# Patient Record
Sex: Female | Born: 2000 | Race: Black or African American | Hispanic: No | Marital: Single | Smoking: Never smoker
Health system: Southern US, Community
[De-identification: ages and names within clinical notes are randomized; demographics above are authoritative.]

---
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
## 2020-02-03 IMAGING — DX DG LUMBAR SPINE COMPLETE 4+V
5 series · 5 of 5 positions shown · non-contrast
Comparison: None.

CLINICAL DATA: Back contusion

EXAM:
LUMBAR SPINE - COMPLETE 4+ VIEW

[l-spine ap]
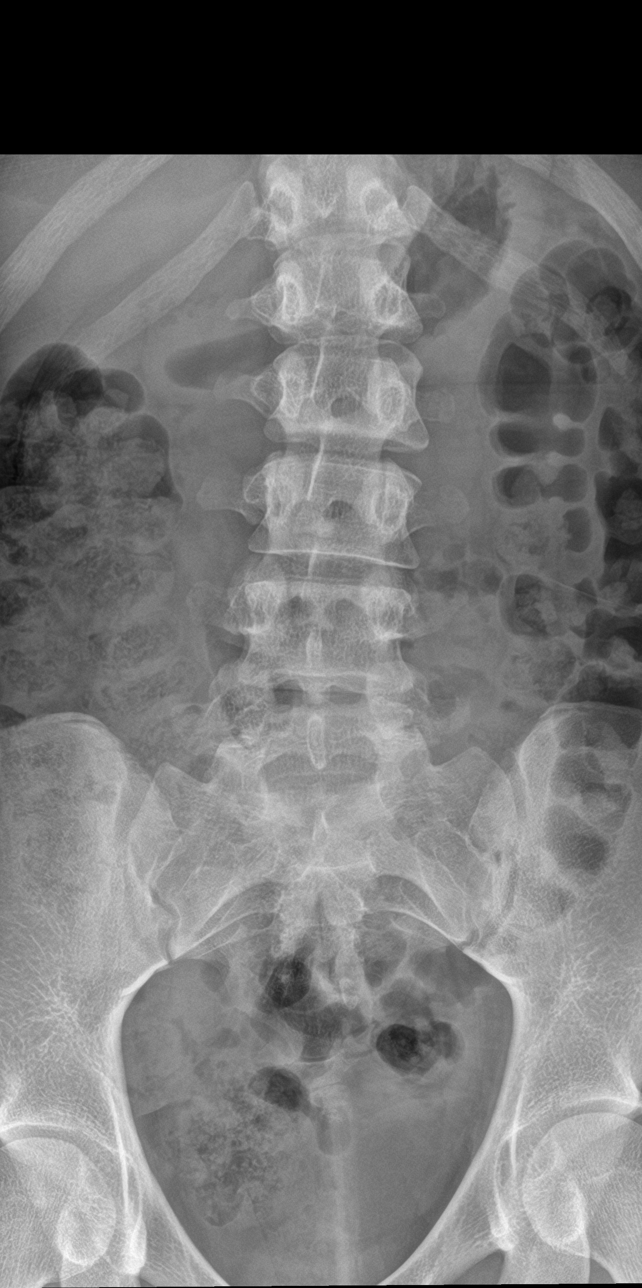

[l-spine obl (1 of 2)]
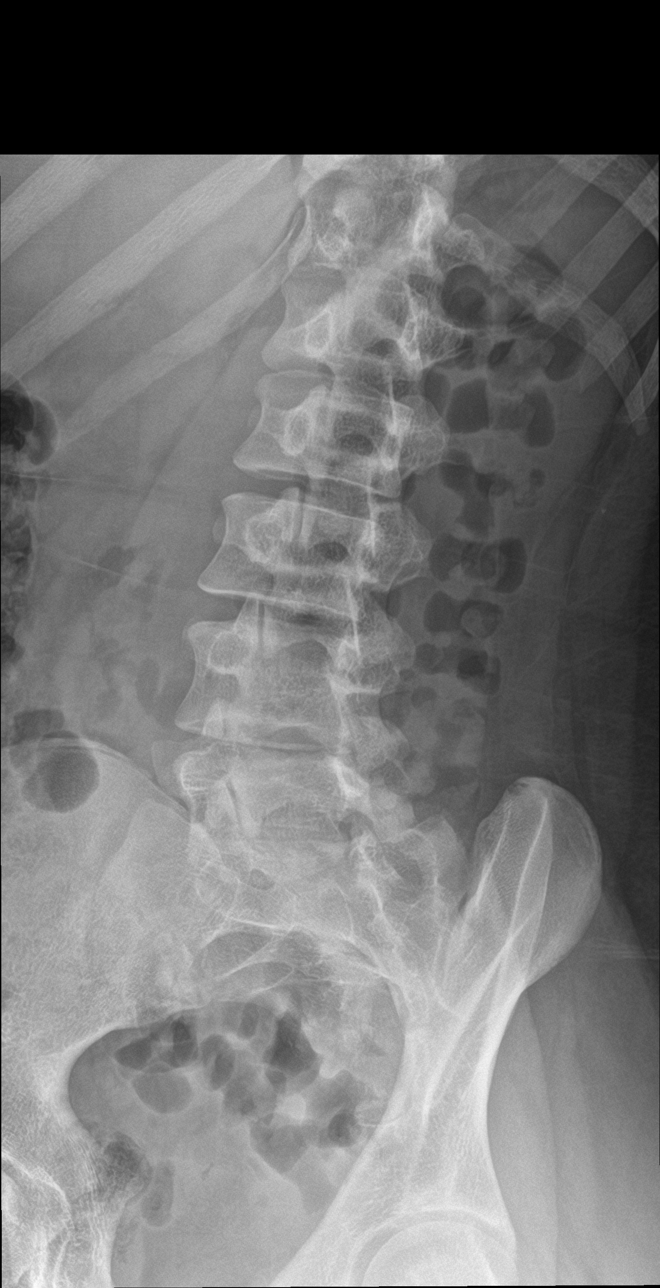

[l-spine obl (2 of 2)]
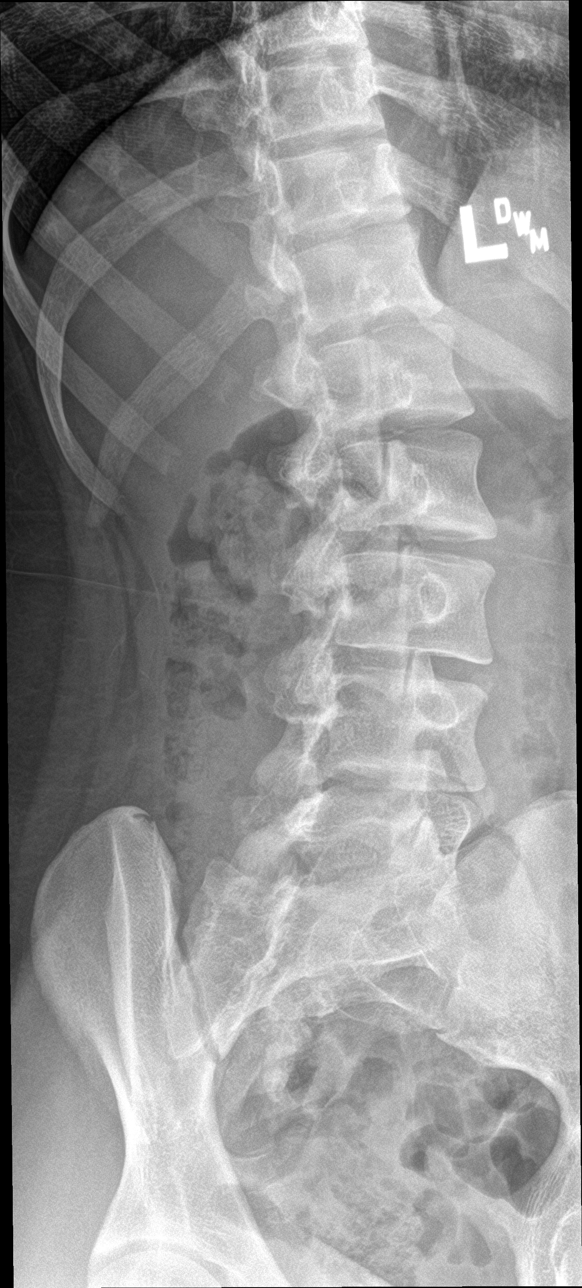

[l-spine lat]
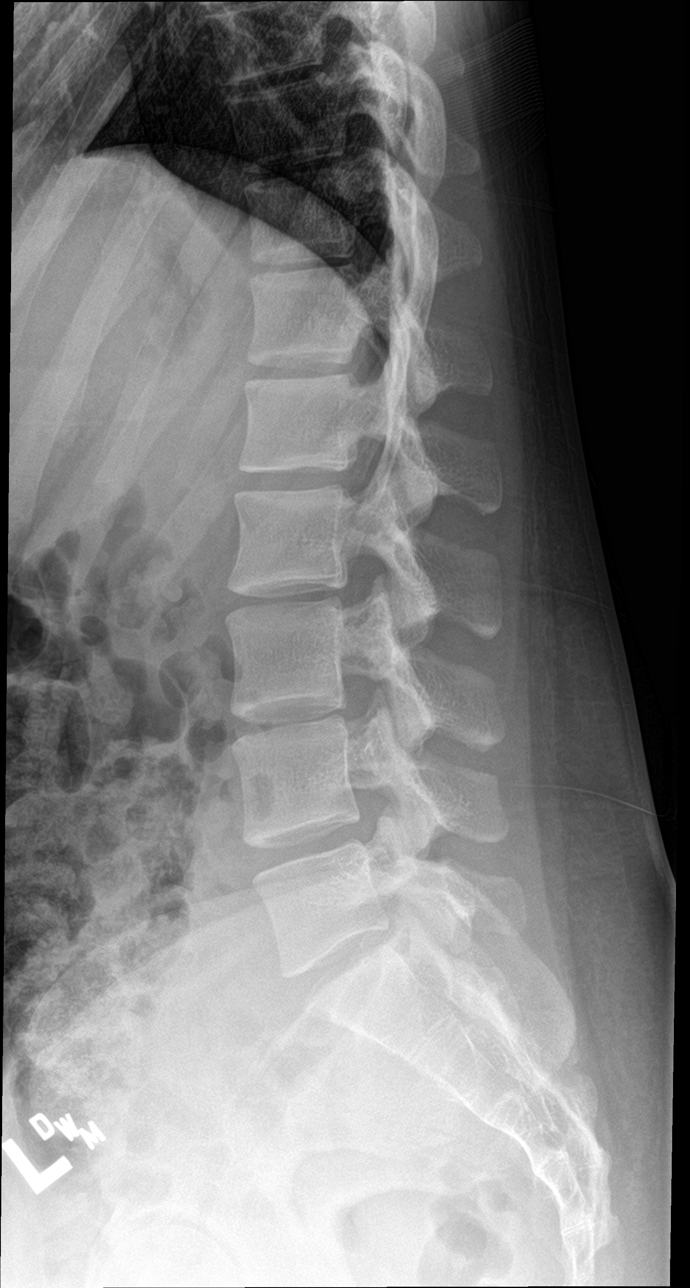

[l-spine spot]
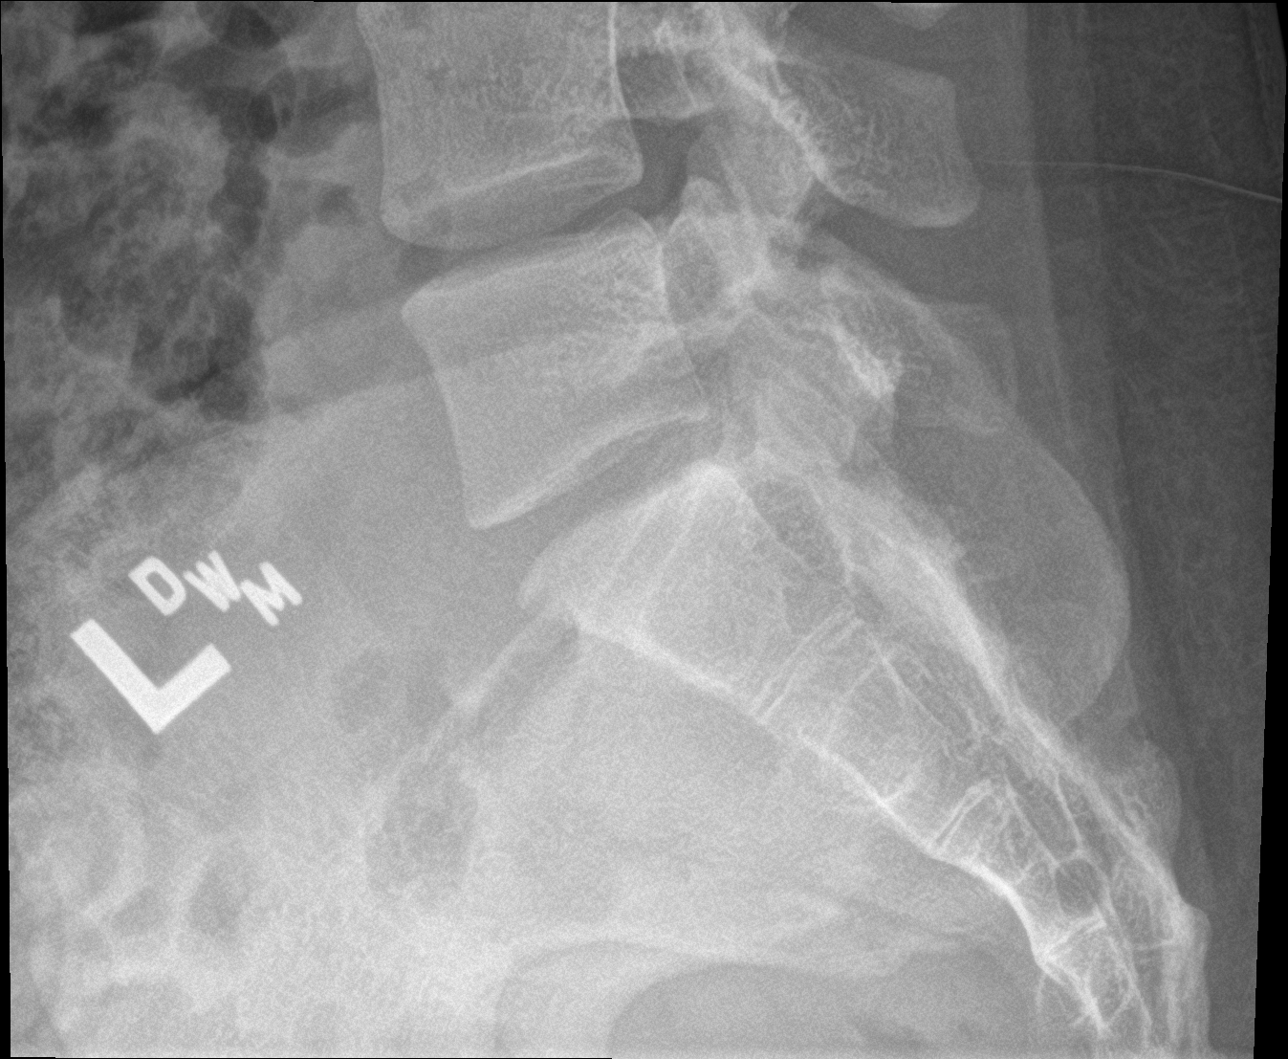

[5 of 5 positions shown; findings below may reference images not displayed]

FINDINGS: Mild levoscoliosis. Negative for fracture. Normal alignment without
focal bony abnormality.
IMPRESSION: Negative

## 2021-05-05 ENCOUNTER — Encounter (HOSPITAL_COMMUNITY): Payer: Self-pay | Admitting: Obstetrics and Gynecology

## 2021-05-05 ENCOUNTER — Inpatient Hospital Stay (HOSPITAL_COMMUNITY)
Admission: AD | Admit: 2021-05-05 | Discharge: 2021-05-05 | Disposition: A | Discharge status: Home / Self Care | Payer: 59 | Attending: Obstetrics and Gynecology | Admitting: Obstetrics and Gynecology

## 2021-05-05 ENCOUNTER — Other Ambulatory Visit: Payer: Self-pay

## 2021-05-05 DIAGNOSIS — O219 Vomiting of pregnancy, unspecified: Secondary | ICD-10-CM | POA: Diagnosis not present

## 2021-05-05 DIAGNOSIS — Z3A08 8 weeks gestation of pregnancy: Secondary | ICD-10-CM | POA: Insufficient documentation

## 2021-05-05 DIAGNOSIS — R0981 Nasal congestion: Secondary | ICD-10-CM | POA: Diagnosis not present

## 2021-05-05 DIAGNOSIS — R109 Unspecified abdominal pain: Secondary | ICD-10-CM | POA: Diagnosis not present

## 2021-05-05 DIAGNOSIS — O26891 Other specified pregnancy related conditions, first trimester: Secondary | ICD-10-CM | POA: Diagnosis present

## 2021-05-05 LAB — URINALYSIS, ROUTINE W REFLEX MICROSCOPIC
Bilirubin Urine: NEGATIVE
Glucose, UA: NEGATIVE mg/dL
Hgb urine dipstick: NEGATIVE
Ketones, ur: 5 mg/dL — AB
Nitrite: NEGATIVE
Protein, ur: NEGATIVE mg/dL
Specific Gravity, Urine: 1.02 (ref 1.005–1.030)
pH: 6 (ref 5.0–8.0)

## 2021-05-05 LAB — POCT PREGNANCY, URINE: Preg Test, Ur: POSITIVE — AB

## 2021-05-05 MED ORDER — GLYCOPYRROLATE 1 MG PO TABS
1.0000 mg | ORAL_TABLET | Freq: Once | ORAL | Status: DC
  Filled 2021-05-05: qty 1

## 2021-05-05 MED ORDER — ONDANSETRON 4 MG PO TBDP
4.0000 mg | ORAL_TABLET | Freq: Once | ORAL | Status: AC
  Administered 2021-05-05: 4 mg via ORAL
  Filled 2021-05-05: qty 1

## 2021-05-05 MED ORDER — ONDANSETRON 4 MG PO TBDP: 4.0000 mg | ORAL_TABLET | Freq: Three times a day (TID) | ORAL | 2 refills | Status: AC | PRN

## 2021-05-05 MED ORDER — GLYCOPYRROLATE 1 MG PO TABS: 1.0000 mg | ORAL_TABLET | Freq: Three times a day (TID) | ORAL | 1 refills | Status: DC | PRN

## 2021-05-05 NOTE — MAU Provider Note (Signed)
History     [id number]  Arrival date and time: 05/05/21 1608    Chief Complaint  Patient presents with   Nausea   Emesis   Nasal Congestion     HPI Morgan Curtis is a 21 y.o. at [redacted]w[redacted]d by LMP, who presents for nausea and vomiting.   Patient reports she has had some mild n/v since becoming pregnant but today became much more significant Has not been able to keep much down Also having intermittent cramping No vaginal bleeding or discharge No burning or pain with urination She has been taking some reglan but it just makes her sleepy and doesn't help much with her nausea     OB History     Gravida  1   Para      Term      Preterm      AB      Living         SAB      IAB      Ectopic      Multiple      Live Births              No past medical history on file.  No past surgical history on file.  No family history on file.  Social History   Socioeconomic History   Marital status: Single    Spouse name: Not on file   Number of children: Not on file   Years of education: Not on file   Highest education level: Not on file  Occupational History   Not on file  Tobacco Use   Smoking status: Never   Smokeless tobacco: Never  Vaping Use   Vaping Use: Never used  Substance and Sexual Activity   Alcohol use: No   Drug use: No   Sexual activity: Never    Birth control/protection: Abstinence  Other Topics Concern   Not on file  Social History Narrative   Not on file   Social Determinants of Health   Financial Resource Strain: Not on file  Food Insecurity: Not on file  Transportation Needs: Not on file  Physical Activity: Not on file  Stress: Not on file  Social Connections: Not on file  Intimate Partner Violence: Not on file    No Known Allergies  No current facility-administered medications on file prior to encounter.   Current Outpatient Medications on File Prior to Encounter  Medication Sig Dispense Refill   metoCLOPramide  (REGLAN) 10 MG tablet Take 10 mg by mouth 4 (four) times daily.     HYDROcodone-acetaminophen (NORCO/VICODIN) 5-325 MG tablet Take 1 tablet by mouth every 6 (six) hours as needed for moderate pain or severe pain. 10 tablet 0     ROS Pertinent positives and negative per HPI, all others reviewed and negative  Physical Exam   BP 118/72 (BP Location: Right Arm)    Pulse 93    Temp 99 F (37.2 C) (Oral)    Resp 17    Ht 5\' 6"  (1.676 m)    Wt 66.7 kg    SpO2 100%    BMI 23.73 kg/m   Patient Vitals for the past 24 hrs:  BP Temp Temp src Pulse Resp SpO2 Height Weight  05/05/21 1750 118/72 99 F (37.2 C) Oral 93 17 100 % 5\' 6"  (1.676 m) 66.7 kg    Physical Exam Vitals reviewed.  Constitutional:      General: She is not in acute distress.    Appearance: She  is well-developed. She is not diaphoretic.  Eyes:     General: No scleral icterus. Pulmonary:     Effort: Pulmonary effort is normal. No respiratory distress.  Abdominal:     General: There is no distension.     Palpations: Abdomen is soft.     Tenderness: There is no abdominal tenderness. There is no guarding or rebound.  Skin:    General: Skin is warm and dry.  Neurological:     Mental Status: She is alert.     Coordination: Coordination normal.     Cervical Exam    Bedside Ultrasound Pt informed that the ultrasound is considered a limited OB ultrasound and is not intended to be a complete ultrasound exam.  Patient also informed that the ultrasound is not being completed with the intent of assessing for fetal or placental anomalies or any pelvic abnormalities.  Explained that the purpose of todays ultrasound is to assess for  viability.  Patient acknowledges the purpose of the exam and the limitations of the study.    My interpretation: viable single IUP with normal fetal cardiac activity seen, yolk sac seen.    Labs Results for orders placed or performed during the hospital encounter of 05/05/21 (from the past 24  hour(s))  Pregnancy, urine POC     Status: Abnormal   Collection Time: 05/05/21  4:30 PM  Result Value Ref Range   Preg Test, Ur POSITIVE (A) NEGATIVE  Urinalysis, Routine w reflex microscopic Urine, Clean Catch     Status: Abnormal   Collection Time: 05/05/21  5:10 PM  Result Value Ref Range   Color, Urine YELLOW YELLOW   APPearance HAZY (A) CLEAR   Specific Gravity, Urine 1.020 1.005 - 1.030   pH 6.0 5.0 - 8.0   Glucose, UA NEGATIVE NEGATIVE mg/dL   Hgb urine dipstick NEGATIVE NEGATIVE   Bilirubin Urine NEGATIVE NEGATIVE   Ketones, ur 5 (A) NEGATIVE mg/dL   Protein, ur NEGATIVE NEGATIVE mg/dL   Nitrite NEGATIVE NEGATIVE   Leukocytes,Ua TRACE (A) NEGATIVE   RBC / HPF 0-5 0 - 5 RBC/hpf   WBC, UA 6-10 0 - 5 WBC/hpf   Bacteria, UA FEW (A) NONE SEEN   Squamous Epithelial / LPF 6-10 0 - 5   Mucus PRESENT     Imaging No results found.  MAU Course  Procedures Lab Orders         Urinalysis, Routine w reflex microscopic Urine, Clean Catch         Pregnancy, urine POC    Meds ordered this encounter  Medications   ondansetron (ZOFRAN-ODT) disintegrating tablet 4 mg   glycopyrrolate (ROBINUL) tablet 1 mg   ondansetron (ZOFRAN-ODT) 4 MG disintegrating tablet    Sig: Take 1 tablet (4 mg total) by mouth every 8 (eight) hours as needed for nausea or vomiting.    Dispense:  30 tablet    Refill:  2   glycopyrrolate (ROBINUL) 1 MG tablet    Sig: Take 1 tablet (1 mg total) by mouth 3 (three) times daily as needed (excessive saliva).    Dispense:  30 tablet    Refill:  1   Imaging Orders  No imaging studies ordered today    MDM moderate  Assessment and Plan  #Nausea and vomiting of pregnancy #[redacted] weeks gestation of pregnancy Given dose of zofran after counseling on possible association with cardiac/craniofacial defects as well as the still overall low absolute risk of these. Significant improvement in symptoms and able to  pass PO challenge. Also reported significant saliva, will  send home with rx for zofran and robinul.   #Abdominal pain in pregnancy, first trimester Viable IUP seen on bedside US, ruled out for ectopic.  Dispo: discharged to home in stable condition.   Venora Maples, MD/MPH 05/05/21 7:45 PM  Allergies as of 05/05/2021   No Known Allergies      Medication List     STOP taking these medications    HYDROcodone-acetaminophen 5-325 MG tablet Commonly known as: NORCO/VICODIN   metoCLOPramide 10 MG tablet Commonly known as: REGLAN       TAKE these medications    glycopyrrolate 1 MG tablet Commonly known as: Robinul Take 1 tablet (1 mg total) by mouth 3 (three) times daily as needed (excessive saliva).   ondansetron 4 MG disintegrating tablet Commonly known as: ZOFRAN-ODT Take 1 tablet (4 mg total) by mouth every 8 (eight) hours as needed for nausea or vomiting.

## 2021-05-05 NOTE — MAU Note (Signed)
...  Morgan Curtis is a 21 y.o. at  here in MAU reporting: Occasional vomiting since 0600 this morning. Last vomiting episode was around 1500 today. She is also endorsing a runny nose since last night. She states she has been having intermittent lower abdominal cramping since the beginning of her pregnancy and currently rates the pain a 5/10.  Has been taking Reglan. Last dose was two days ago. She states she has not been taking it because it tends to make her drowsy.  Pain score:  5/10 lower abdomen  Lab orders placed from triage: UA

## 2021-05-18 HISTORY — PX: DILATION AND CURETTAGE OF UTERUS: SHX78

## 2021-09-17 ENCOUNTER — Encounter: Payer: Self-pay | Admitting: Student

## 2021-09-17 ENCOUNTER — Inpatient Hospital Stay (HOSPITAL_COMMUNITY)
Admission: AD | Admit: 2021-09-17 | Discharge: 2021-09-17 | Disposition: A | Discharge status: Home / Self Care | Payer: 59 | Attending: Obstetrics | Admitting: Obstetrics

## 2021-09-17 DIAGNOSIS — Z3A11 11 weeks gestation of pregnancy: Secondary | ICD-10-CM

## 2021-09-17 DIAGNOSIS — O219 Vomiting of pregnancy, unspecified: Secondary | ICD-10-CM | POA: Diagnosis not present

## 2021-09-17 DIAGNOSIS — O209 Hemorrhage in early pregnancy, unspecified: Secondary | ICD-10-CM | POA: Diagnosis not present

## 2021-09-17 LAB — URINALYSIS, ROUTINE W REFLEX MICROSCOPIC
Bilirubin Urine: NEGATIVE
Glucose, UA: NEGATIVE mg/dL
Hgb urine dipstick: NEGATIVE
Ketones, ur: 20 mg/dL — AB
Leukocytes,Ua: NEGATIVE
Nitrite: NEGATIVE
Protein, ur: NEGATIVE mg/dL
Specific Gravity, Urine: 1.027 (ref 1.005–1.030)
pH: 6 (ref 5.0–8.0)

## 2021-09-17 NOTE — MAU Note (Signed)
...  Morgan Curtis is a 21 y.o. at [redacted]w[redacted]d here in MAU reporting: "I woke up this morning not feeling great." She reports she has been experiencing N/V her entire pregnancy and has ODT Zofran at home. Last dose 30 minutes ago. She reports she noted vaginal spotting that was "reddish/pinkish/brownish" when using the restroom around 0900 this morning. She is endorsing cramping/bloating "since the beginning" of her pregnancy. Denies VB, LOF, and recent IC. Denies vaginal itching and vaginal odors.  Pain score: 4/10 lower abdomen  Vitals:   09/17/21 1202  BP: (!) 109/58  Pulse: 66  Resp: 14  Temp: 98.1 F (36.7 C)  SpO2: 99%     FHT: 162 doppler Lab orders placed from triage: UA

## 2021-09-17 NOTE — Discharge Instructions (Signed)
Return to care  If you have heavier bleeding that soaks through more than 2 pads per hour for an hour or more If you bleed so much that you feel like you might pass out or you do pass out If you have significant abdominal pain that is not improved with Tylenol   

## 2021-09-17 NOTE — Progress Notes (Signed)
Raiford Noble, NP at beside implementing beside U/S.    Addison Naegeli, RN

## 2021-09-17 NOTE — MAU Provider Note (Signed)
History     [id number]  Arrival date and time: 09/17/21 1151    Chief Complaint  Patient presents with   Abdominal Pain   Nausea   Emesis   Vaginal Bleeding     HPI Morgan Curtis is a 21 y.o. at [redacted]w[redacted]d who presents for vaginal bleeding.  Has had ongoing nausea & vomiting that she's been treating with zofran. Last dose was this morning. Has been having constipation as well since she started taking zofran which she hasn't treated.  This morning she noticed pink/brown spotting on toilet paper. Not bleeding into a pad or passing blood clots. Had some abdominal cramping after she noticed the bleeding. No abnormal discharge or vaginal irritation. No recent intercourse or exams.   OB History     Gravida  2   Para      Term      Preterm      AB  1   Living         SAB  1   IAB      Ectopic      Multiple      Live Births              History reviewed. No pertinent past medical history.  Past Surgical History:  Procedure Laterality Date   DILATION AND CURETTAGE OF UTERUS  05/18/2021    History reviewed. No pertinent family history.  Allergies  Allergen Reactions   Iodine     No current facility-administered medications on file prior to encounter.   Current Outpatient Medications on File Prior to Encounter  Medication Sig Dispense Refill   ondansetron (ZOFRAN-ODT) 4 MG disintegrating tablet Take 1 tablet (4 mg total) by mouth every 8 (eight) hours as needed for nausea or vomiting. 30 tablet 2   Prenatal Vit-Fe Fumarate-FA (PREPLUS) 27-1 MG TABS Take 1 tablet by mouth daily.       ROS Pertinent positives and negative per HPI, all others reviewed and negative  Physical Exam   BP (!) 111/58 (BP Location: Left Arm)   Pulse 66   Temp 98.9 F (37.2 C) (Oral)   Resp 17   Ht 5' 6.5" (1.689 m)   Wt 65.3 kg   SpO2 100%   Breastfeeding Unknown   BMI 22.89 kg/m   Patient Vitals for the past 24 hrs:  BP Temp Temp src Pulse Resp SpO2 Height Weight   09/17/21 1422 (!) 111/58 98.9 F (37.2 C) Oral 66 17 100 % -- --  09/17/21 1225 117/67 97.9 F (36.6 C) Oral 67 18 100 % -- --  09/17/21 1202 (!) 109/58 98.1 F (36.7 C) Oral 66 14 99 % 5' 6.5" (1.689 m) 65.3 kg    Physical Exam Vitals and nursing note reviewed. Exam conducted with a chaperone present.  Constitutional:      General: She is not in acute distress.    Appearance: She is well-developed.  HENT:     Head: Normocephalic and atraumatic.  Pulmonary:     Effort: Pulmonary effort is normal. No respiratory distress.  Abdominal:     General: Abdomen is flat.     Palpations: Abdomen is soft.     Tenderness: There is no abdominal tenderness.  Genitourinary:    Comments: Cervix closed. No blood.  Skin:    General: Skin is warm and dry.  Neurological:     Mental Status: She is alert.      Bedside Ultrasound Pt informed that the ultrasound is considered a  limited OB ultrasound and is not intended to be a complete ultrasound exam.  Patient also informed that the ultrasound is not being completed with the intent of assessing for fetal or placental anomalies or any pelvic abnormalities.  Explained that the purpose of today's ultrasound is to assess for  viability.  Patient acknowledges the purpose of the exam and the limitations of the study.     My interpretation: Live IUP. Active fetus. FHR 167 bpm   Labs Results for orders placed or performed during the hospital encounter of 09/17/21 (from the past 24 hour(s))  Urinalysis, Routine w reflex microscopic Urine, Clean Catch     Status: Abnormal   Collection Time: 09/17/21 12:12 PM  Result Value Ref Range   Color, Urine YELLOW YELLOW   APPearance HAZY (A) CLEAR   Specific Gravity, Urine 1.027 1.005 - 1.030   pH 6.0 5.0 - 8.0   Glucose, UA NEGATIVE NEGATIVE mg/dL   Hgb urine dipstick NEGATIVE NEGATIVE   Bilirubin Urine NEGATIVE NEGATIVE   Ketones, ur 20 (A) NEGATIVE mg/dL   Protein, ur NEGATIVE NEGATIVE mg/dL   Nitrite  NEGATIVE NEGATIVE   Leukocytes,Ua NEGATIVE NEGATIVE    Imaging No results found.  MAU Course  Procedures Lab Orders         Urinalysis, Routine w reflex microscopic Urine, Clean Catch    No orders of the defined types were placed in this encounter.  Imaging Orders  No imaging studies ordered today    MDM Asked to doppler again while partner was in the room but unable to so proceeded with bedside ultrasound. See note under physical exam.   Reviewed records at [facility] under care everywhere. O positive blood type. Ultrasound confirmed IUP on 6/23 appt.   Patient declines any changes to nausea management. Just wanted to make sure baby was ok due to hx of miscarriage.  Assessment and Plan   1. Nausea and vomiting during pregnancy prior to [redacted] weeks gestation   2. Vaginal bleeding in pregnancy, first trimester   3. [redacted] weeks gestation of pregnancy    -Continue antiemetic as prescribed.  -Discussed constipation management -Reviewed bleeding precautions & reasons to return to MAU  Judeth Horn, NP 09/17/21 2:29 PM
